# Patient Record
Sex: Female | Born: 2005 | Race: Black or African American | Hispanic: No | Marital: Single | Smoking: Never smoker
Health system: Southern US, Community
[De-identification: ages and names within clinical notes are randomized; demographics above are authoritative.]

## PROBLEM LIST (undated history)

## (undated) DIAGNOSIS — L709 Acne, unspecified: Secondary | ICD-10-CM

## (undated) DIAGNOSIS — M419 Scoliosis, unspecified: Secondary | ICD-10-CM

---
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
## 2023-01-25 ENCOUNTER — Ambulatory Visit (INDEPENDENT_AMBULATORY_CARE_PROVIDER_SITE_OTHER): Payer: Commercial Managed Care - PPO

## 2023-01-25 ENCOUNTER — Ambulatory Visit
Admission: EM | Admit: 2023-01-25 | Discharge: 2023-01-25 | Disposition: A | Discharge status: Home / Self Care | Payer: Commercial Managed Care - PPO | Attending: Family Medicine | Admitting: Family Medicine

## 2023-01-25 DIAGNOSIS — G43109 Migraine with aura, not intractable, without status migrainosus: Secondary | ICD-10-CM | POA: Insufficient documentation

## 2023-01-25 LAB — PREGNANCY, URINE: Preg Test, Ur: NEGATIVE

## 2023-01-25 MED ORDER — ONDANSETRON 4 MG PO TBDP: 4.0000 mg | ORAL_TABLET | Freq: Three times a day (TID) | ORAL | 0 refills | Status: AC | PRN

## 2023-01-25 NOTE — Discharge Instructions (Addendum)
Your head CT scan did not show any bleeding, bone issues, masses, stroke or increased fluid on the brain.    For you headache:  Follow up with your primary care provider to speak about headaches as you may need to see a neurologist.    Take 1 tablet benadryl, 1 tablet of zofran, 1 tablet of Tylenol with 400 mg Ibuprofen for your headache. Stop by the pharmacy to pick up your prescriptions.

## 2023-01-25 NOTE — ED Provider Notes (Signed)
[facility]    CSN: [id number] Arrival date & time: 01/25/23  1204      History   Chief Complaint Chief Complaint  Patient presents with   Migraine    HPI Bethany Mcguire is a 17 y.o. female.   HPI   Bethany Mcguire presents for left sided headache above her eye that started 2 days ago. Pain described as tightness and throbbing. No head pain. Pain lasts 1-2 hours. She gets headaches often. She woke up with a headache this morning. Has blurry vision on the side of her right peripheral side. She tried to lay back down to sleep but wasn't able sleep due to her head pain. She tends to not have headaches upon waking up or blurry vision. Coughing or bending down gives her a sharp pain above her left eye.   She went to the eye doctor recently but was told her vision was fine. Was told her vision is not causing the headaches.   She took Tylenol yesterday for pain without relief.  Her PCP prescribed her some medicine previously but that doesn't help.    Associated Symptoms Nausea/vomiting: yes, nausea  Photophobia/phonophobia: yes  Tearing of eyes: no  Sinus pain/pressure: no  Family hx migraine: no  Personal stressors: no  Relation to menstrual cycle: no   Fever: no  Neck pain/stiffness: no  Vision/speech/swallow/hearing difficulty: yes vision  Focal weakness/numbness: no  Altered mental status: no  Trauma: no  New type of headache: yes  Anticoagulant use: no  H/o cancer/HIV/Pregnancy: no      Past Medical History:  Diagnosis Date   Acne    Scoliosis     There are no problems to display for this patient.   History reviewed. No pertinent surgical history.  OB History   No obstetric history on file.      Home Medications    Prior to Admission medications   Medication Sig Start Date End Date Taking? Authorizing Provider  JUNEL FE 1.5/30 1.5-30 MG-MCG tablet Take 1 tablet by mouth daily. 12/06/22  Yes [provider]  ondansetron (ZOFRAN-ODT) 4 MG  disintegrating tablet Take 1 tablet (4 mg total) by mouth every 8 (eight) hours as needed. 01/25/23  Yes Lidya Mccalister, DO  Adapalene-Benzoyl Peroxide (EPIDUO) 0.1-2.5 % gel APPLY A PEA-SIZED AMOUNT TO THE ENTIRE FACE EACH NIGHT 04/23/17   [provider]  doxycycline (VIBRAMYCIN) 100 MG capsule Take 100 mg by mouth daily.    [provider]  omeprazole (PRILOSEC) 20 MG capsule Take 1 capsule by mouth daily as needed. 09/16/17   [provider]    Family History Family History  Problem Relation Age of Onset   Acne Mother    Hypertension Father     Social History Social History   Tobacco Use   Smoking status: Never   Smokeless tobacco: Never  Vaping Use   Vaping Use: Never used  Substance Use Topics   Alcohol use: No   Drug use: No     Allergies   Patient has no known allergies.   Review of Systems Review of Systems: negative unless otherwise stated in HPI.      Physical Exam Triage Vital Signs ED Triage Vitals  Enc Vitals Group     BP 01/25/23 1241 128/85     Pulse Rate 01/25/23 1241 94     Resp 01/25/23 1241 16     Temp 01/25/23 1241 98.1 F (36.7 C)     Temp Source 01/25/23 1241 Oral  SpO2 01/25/23 1241 98 %     Weight --      Height --      Head Circumference --      Peak Flow --      Pain Score 01/25/23 1242 7     Pain Loc --      Pain Edu? --      Excl. in GC? --    No data found.  Updated Vital Signs BP 128/85 (BP Location: Right Arm)   Pulse 94   Temp 98.1 F (36.7 C) (Oral)   Resp 16   LMP 01/02/2023 (Exact Date) Comment: neg preg test 01/25/23  SpO2 98%   Visual Acuity Right Eye Distance: 20/15 straight ahead Left Eye Distance: 20/13 straight  ahead Bilateral Distance: 20/20  straight ahead  Right Eye Near:   Left Eye Near:    Bilateral Near:     Physical Exam GEN:     alert, well appearing female and no distress    HENT:  mucus membranes moist, oropharyngeal without lesions or erythema,  nares patent,  no nasal discharge  EYES:   pupils equal and reactive, EOM intact NECK:  supple, normal ROM, no lymphadenopathy  RESP:  clear to auscultation bilaterally, no increased work of breathing  CVS:   regular rate and rhythm, no murmur, distal pulses intact  EXT:   normal ROM, atraumatic, no edema  NEURO:  alert, oriented, speech normal, CN 2-12 grossly intact, no facial droop,  sensation grossly intact, strength 5/5 bilateral UE and LE, normal coordination, normal finger to nose Skin:   warm and dry, no rash, normal skin turgor Psych: Normal affect, appropriate speech and behavior      UC Treatments / Results  Labs (all labs ordered are listed, but only abnormal results are displayed) Labs Reviewed  PREGNANCY, URINE    EKG  If EKG performed, see my interpretation in the MDM section  Radiology CT Head Wo Contrast  Result Date: 01/25/2023 CLINICAL DATA:  Headache, new onset (Age >= 51y) EXAM: CT HEAD WITHOUT CONTRAST TECHNIQUE: Contiguous axial images were obtained from the base of the skull through the vertex without intravenous contrast. RADIATION DOSE REDUCTION: This exam was performed according to the departmental dose-optimization program which includes automated exposure control, adjustment of the mA and/or kV according to patient size and/or use of iterative reconstruction technique. COMPARISON:  None Available. FINDINGS: Brain: No acute intracranial abnormality. Specifically, no hemorrhage, hydrocephalus, mass lesion, acute infarction, or significant intracranial injury. Vascular: No hyperdense vessel or unexpected calcification. Skull: No acute calvarial abnormality. Sinuses/Orbits: No acute findings Other: None IMPRESSION: Normal study. Electronically Signed   By: Charlett Nose M.D.   On: 01/25/2023 13:57     Procedures Procedures (including critical care time)  Medications Ordered in UC Medications - No data to display  Initial Impression / Assessment and Plan / UC Course  I have  reviewed the triage vital signs and the nursing notes.  Pertinent labs & imaging results that were available during my care of the patient were reviewed by me and considered in my medical decision making (see chart for details).      Patient is a 17 year old female with history of frequent headaches who presents for headache for the past 3 days.  Differential diagnosis includes but is not limited to migraine headache, tension headache, cluster headache, CVA, depression, intracranial hemorrhage, meningitis/encephalitis, giant cell arteritis, idiopathic intracranial hypertension, dehydration and analgesia abuse. There are no red flag symptoms associated with headache.  On chart review, there is no head imaging available.    Vital signs are stable.  Overall, patient is well-appearing, well-hydrated and in no acute distress.  Cardiopulmonary and neuro exams are normal.  Patient reports pain and nausea have resolved. Dicussed obtaining a CT Head to rule out intracranial processes and pt is agreeable. CT without acute infarct, masses, evidence of hemorrhage, cranial fracture or hydrocephalus.    Treat with headache cocktail (Benadryl, Zofran, Tylenol and Naprosyn) at home. ED and return precautions reviewed. Advised to follow up with PCP to discuss referral to neurology for frequent headaches and suspected new onset migraine with aura.   Discussed MDM, treatment plan and plan for follow-up with patient who agrees with plan.     Final Clinical Impressions(s) / UC Diagnoses   Final diagnoses:  Migraine with aura and without status migrainosus, not intractable     Discharge Instructions      Your head CT scan did not show any bleeding, bone issues, masses, stroke or increased fluid on the brain.    For you headache:  Follow up with your primary care provider to speak about headaches as you may need to see a neurologist.    Take 1 tablet benadryl, 1 tablet of zofran, 1 tablet of Tylenol with 400  mg Ibuprofen for your headache. Stop by the pharmacy to pick up your prescriptions.        ED Prescriptions     Medication Sig Dispense Auth. Provider   ondansetron (ZOFRAN-ODT) 4 MG disintegrating tablet Take 1 tablet (4 mg total) by mouth every 8 (eight) hours as needed. 20 tablet Katha Cabal, DO      PDMP not reviewed this encounter.   Katha Cabal, DO 01/25/23 1502

## 2023-01-25 NOTE — ED Notes (Addendum)
Spoke with [facility] management at [phone] for CT head wo Contrast prior auth, spoke with Theron Arista stated prior auth not required.

## 2023-01-25 NOTE — ED Notes (Signed)
Pt states the peripheral vision in her right eye is blurry.

## 2023-01-25 NOTE — ED Triage Notes (Signed)
Pt reports she has been having headaches x 2 days. They only occur on the left side of her head. She has had N/V. Pt states peripheral  vision in right eye is blurred. Took tylenol but no relief.

## 2024-09-25 ENCOUNTER — Ambulatory Visit
Admission: EM | Admit: 2024-09-25 | Discharge: 2024-09-25 | Disposition: A | Discharge status: Home / Self Care | Attending: Emergency Medicine | Admitting: Emergency Medicine

## 2024-09-25 DIAGNOSIS — J069 Acute upper respiratory infection, unspecified: Secondary | ICD-10-CM

## 2024-09-25 LAB — POCT INFLUENZA A/B
Influenza A, POC: NEGATIVE
Influenza B, POC: NEGATIVE

## 2024-09-25 MED ORDER — PROMETHAZINE-DM 6.25-15 MG/5ML PO SYRP: 5.0000 mL | ORAL_SOLUTION | Freq: Four times a day (QID) | ORAL | 0 refills | Status: AC | PRN

## 2024-09-25 MED ORDER — BENZONATATE 100 MG PO CAPS: 200.0000 mg | ORAL_CAPSULE | Freq: Three times a day (TID) | ORAL | 0 refills | Status: AC

## 2024-09-25 MED ORDER — IPRATROPIUM BROMIDE 0.06 % NA SOLN: 2.0000 | Freq: Four times a day (QID) | NASAL | 12 refills | Status: AC

## 2024-09-25 NOTE — ED Provider Notes (Signed)
 " [facility]    CSN: [id number] Arrival date & time: 09/25/24  1403      History   Chief Complaint Chief Complaint  Patient presents with   Cough    HPI Bethany Mcguire is a 19 y.o. female.   HPI  19 year old female with past medical history significant for acne and scoliosis presents for evaluation of flulike symptoms that began 2 days ago with a cough.  She is reporting feeling feverish and developing body aches this morning, along with runny nose, nasal congestion, irritated throat, and a productive cough for yellow sputum.  She denies ear pain, shortness breath, wheezing, known sick contacts, recent travel out of state or country.  Past Medical History:  Diagnosis Date   Acne    Scoliosis     There are no active problems to display for this patient.   History reviewed. No pertinent surgical history.  OB History   No obstetric history on file.      Home Medications    Prior to Admission medications  Medication Sig Start Date End Date Taking? Authorizing Provider  benzonatate (TESSALON) 100 MG capsule Take 2 capsules (200 mg total) by mouth every 8 (eight) hours. 09/25/24  Yes Bernardino Ditch, NP  ipratropium (ATROVENT) 0.06 % nasal spray Place 2 sprays into both nostrils 4 (four) times daily. 09/25/24  Yes Bernardino Ditch, NP  promethazine-dextromethorphan (PROMETHAZINE-DM) 6.25-15 MG/5ML syrup Take 5 mLs by mouth 4 (four) times daily as needed. 09/25/24  Yes Bernardino Ditch, NP  Adapalene-Benzoyl Peroxide (EPIDUO) 0.1-2.5 % gel APPLY A PEA-SIZED AMOUNT TO THE ENTIRE FACE EACH NIGHT 04/23/17   [provider]  doxycycline (VIBRAMYCIN) 100 MG capsule Take 100 mg by mouth daily.    [provider]  JUNEL FE 1.5/30 1.5-30 MG-MCG tablet Take 1 tablet by mouth daily. 12/06/22   [provider]  omeprazole (PRILOSEC) 20 MG capsule Take 1 capsule by mouth daily as needed. 09/16/17   [provider]  ondansetron  (ZOFRAN -ODT) 4 MG disintegrating  tablet Take 1 tablet (4 mg total) by mouth every 8 (eight) hours as needed. 01/25/23   Brimage, Vondra, DO    Family History Family History  Problem Relation Age of Onset   Acne Mother    Hypertension Father     Social History Social History[1]   Allergies   Patient has no known allergies.   Review of Systems Review of Systems  Constitutional:  Positive for fatigue and fever.  HENT:  Positive for congestion, rhinorrhea and sore throat. Negative for ear pain.   Respiratory:  Positive for cough. Negative for shortness of breath and wheezing.   Musculoskeletal:  Positive for arthralgias and myalgias.     Physical Exam Triage Vital Signs ED Triage Vitals  Encounter Vitals Group     BP      Girls Systolic BP Percentile      Girls Diastolic BP Percentile      Boys Systolic BP Percentile      Boys Diastolic BP Percentile      Pulse      Resp      Temp      Temp src      SpO2      Weight      Height      Head Circumference      Peak Flow      Pain Score      Pain Loc      Pain Education  Exclude from Growth Chart    No data found.  Updated Vital Signs BP 119/80 (BP Location: Right Arm)   Pulse 79   Temp 98.5 F (36.9 C) (Oral)   Resp 16   Wt 152 lb (68.9 kg)   LMP 08/27/2024 (Exact Date)   SpO2 97%   Visual Acuity Right Eye Distance:   Left Eye Distance:   Bilateral Distance:    Right Eye Near:   Left Eye Near:    Bilateral Near:     Physical Exam Vitals and nursing note reviewed.  Constitutional:      Appearance: Normal appearance. She is not ill-appearing.  HENT:     Head: Normocephalic and atraumatic.     Right Ear: Tympanic membrane, ear canal and external ear normal. There is no impacted cerumen.     Left Ear: Tympanic membrane, ear canal and external ear normal. There is no impacted cerumen.     Nose: Congestion and rhinorrhea present.     Comments: Nasal mucosa is edematous and erythematous with clear discharge in both nares.     Mouth/Throat:     Mouth: Mucous membranes are moist.     Pharynx: Oropharynx is clear. No oropharyngeal exudate or posterior oropharyngeal erythema.  Cardiovascular:     Rate and Rhythm: Normal rate and regular rhythm.     Pulses: Normal pulses.     Heart sounds: Normal heart sounds. No murmur heard.    No friction rub. No gallop.  Pulmonary:     Effort: Pulmonary effort is normal.     Breath sounds: Normal breath sounds. No wheezing, rhonchi or rales.  Musculoskeletal:     Cervical back: Normal range of motion and neck supple. No tenderness.  Lymphadenopathy:     Cervical: No cervical adenopathy.  Skin:    General: Skin is warm and dry.     Capillary Refill: Capillary refill takes less than 2 seconds.     Findings: No rash.  Neurological:     General: No focal deficit present.     Mental Status: She is alert and oriented to person, place, and time.      UC Treatments / Results  Labs (all labs ordered are listed, but only abnormal results are displayed) Labs Reviewed  POCT INFLUENZA A/B - Normal    EKG   Radiology No results found.  Procedures Procedures (including critical care time)  Medications Ordered in UC Medications - No data to display  Initial Impression / Assessment and Plan / UC Course  I have reviewed the triage vital signs and the nursing notes.  Pertinent labs & imaging results that were available during my care of the patient were reviewed by me and considered in my medical decision making (see chart for details).   Patient is a nontoxic-appearing 19 year old female presenting for evaluation of flulike symptoms as outlined in HPI above.  She reports that she tested herself at home for COVID and it was negative.  She did develop a slight bloody nose on the right-hand side after she swabbed herself.  There is some faint bloody discharge present in the right nare but not the left.  I suspect this is most likely due to her friable nasal mucosa.  She is  requesting influenza testing so I will order an influenza antigen test.  Influenza antigen test is negative.  I will discharge patient home with a diagnosis of viral URI with cough.  I will prescribe Atrovent nasal spray for nasal congestion and Tessalon  Perles and Promethazine DM cough syrup for cough and congestion.  She should use over-the-counter Tylenol and/or ibuprofen as needed for fever or pain.  Return precautions reviewed.   Final Clinical Impressions(s) / UC Diagnoses   Final diagnoses:  Viral URI with cough     Discharge Instructions      Your flu test was negative.  Your exam is consistent with a viral respiratory infection.  Please continue to use over-the-counter Tylenol and/or ibuprofen according to the package instructions as needed for any fever or pain.  Use the Atrovent nasal spray, 2 squirts in each nostril every 6 hours, as needed for runny nose and postnasal drip.  Use the Tessalon Perles every 8 hours during the day.  Take them with a small sip of water.  They may give you some numbness to the base of your tongue or a metallic taste in your mouth, this is normal.  Use the Promethazine DM cough syrup at bedtime for cough and congestion.  It will make you drowsy so do not take it during the day.  Return for reevaluation or see your primary care provider for any new or worsening symptoms.      ED Prescriptions     Medication Sig Dispense Auth. Provider   benzonatate (TESSALON) 100 MG capsule Take 2 capsules (200 mg total) by mouth every 8 (eight) hours. 21 capsule Bernardino Ditch, NP   ipratropium (ATROVENT) 0.06 % nasal spray Place 2 sprays into both nostrils 4 (four) times daily. 15 mL Bernardino Ditch, NP   promethazine-dextromethorphan (PROMETHAZINE-DM) 6.25-15 MG/5ML syrup Take 5 mLs by mouth 4 (four) times daily as needed. 118 mL Bernardino Ditch, NP      PDMP not reviewed this encounter.     [1]  Social History Tobacco Use   Smoking status: Never    Smokeless tobacco: Never  Vaping Use   Vaping status: Never Used  Substance Use Topics   Alcohol use: No   Drug use: No     Bernardino Ditch, NP 09/25/24 1542  "

## 2024-09-25 NOTE — Discharge Instructions (Signed)
 Your flu test was negative.  Your exam is consistent with a viral respiratory infection.  Please continue to use over-the-counter Tylenol and/or ibuprofen according to the package instructions as needed for any fever or pain.  Use the Atrovent nasal spray, 2 squirts in each nostril every 6 hours, as needed for runny nose and postnasal drip.  Use the Tessalon Perles every 8 hours during the day.  Take them with a small sip of water.  They may give you some numbness to the base of your tongue or a metallic taste in your mouth, this is normal.  Use the Promethazine DM cough syrup at bedtime for cough and congestion.  It will make you drowsy so do not take it during the day.  Return for reevaluation or see your primary care provider for any new or worsening symptoms.

## 2024-09-25 NOTE — ED Triage Notes (Addendum)
 Patient states that she had a cough that started Tuesday. Bodyaches back pain fatigue fevrish started this morning  Tested neg for covid at home. Wants flu test
